# Patient Record
Sex: Female | Born: 1970 | Race: Black or African American | Hispanic: No | Marital: Married | Smoking: Former smoker
Health system: Southern US, Community
[De-identification: ages and names within clinical notes are randomized; demographics above are authoritative.]

---

## 2006-04-21 ENCOUNTER — Emergency Department: Payer: Self-pay | Admitting: Emergency Medicine

## 2007-01-16 ENCOUNTER — Emergency Department: Payer: Self-pay | Admitting: Emergency Medicine

## 2013-11-30 ENCOUNTER — Emergency Department: Payer: Self-pay | Admitting: Emergency Medicine

## 2015-06-27 ENCOUNTER — Ambulatory Visit: Payer: Self-pay

## 2015-07-11 ENCOUNTER — Ambulatory Visit: Payer: Self-pay

## 2015-07-18 ENCOUNTER — Encounter: Payer: Self-pay | Admitting: *Deleted

## 2015-07-18 ENCOUNTER — Ambulatory Visit: Payer: Self-pay | Attending: Oncology | Admitting: *Deleted

## 2015-07-18 ENCOUNTER — Ambulatory Visit
Admission: RE | Admit: 2015-07-18 | Discharge: 2015-07-18 | Disposition: A | Discharge status: Home / Self Care | Payer: Self-pay | Source: Ambulatory Visit | Attending: Oncology | Admitting: Oncology

## 2015-07-18 ENCOUNTER — Other Ambulatory Visit: Payer: Self-pay | Admitting: Oncology

## 2015-07-18 VITALS — BP 144/91 | HR 96 | Temp 96.7°F | Resp 16 | Ht 67.72 in | Wt 318.1 lb

## 2015-07-18 DIAGNOSIS — Z0000 Encounter for general adult medical examination without abnormal findings: Secondary | ICD-10-CM

## 2015-07-18 NOTE — Progress Notes (Signed)
Subjective:     Patient ID: Alexandra Velazquez, female   DOB: 1971-08-29, 44 y.o.   MRN: [id number]  HPI   Review of Systems     Objective:   Physical Exam  Pulmonary/Chest: Right breast exhibits no inverted nipple, no mass, no nipple discharge, no skin change and no tenderness. Left breast exhibits no inverted nipple, no mass, no nipple discharge and no skin change. Breasts are asymmetrical.    Genitourinary: Uterus is not deviated, not enlarged, not fixed and not tender. Cervix exhibits friability. Cervix exhibits no discharge. Right adnexum displays no mass, no tenderness and no fullness. Left adnexum displays no mass, no tenderness and no fullness. No erythema, tenderness or bleeding in the vagina. No foreign body around the vagina. No signs of injury around the vagina. No vaginal discharge found.         Assessment:     43 year ol Black obese female presents to [facility] for clinical breast exam and pap smear.  Clinical breast exam unremarkable.  Taught self breast awareness.  Patient states she has had somewhat irregular periods in the past, but had not had a period since January until this month.  States she had a normal period this month with heavy flow, lasting about 10 days. Discussed signs of menapause and  encouraged birth control.  Specimen collected for pap smear.    Plan:     Screening mammogram ordered.  Will follow-up per protocol.  Encouraged to discuss irregular cycle with her primary care provider.

## 2015-07-19 ENCOUNTER — Other Ambulatory Visit: Payer: Self-pay | Admitting: *Deleted

## 2015-07-19 DIAGNOSIS — N6489 Other specified disorders of breast: Secondary | ICD-10-CM

## 2015-07-23 ENCOUNTER — Ambulatory Visit: Payer: Self-pay

## 2015-07-23 ENCOUNTER — Ambulatory Visit
Admission: RE | Admit: 2015-07-23 | Discharge: 2015-07-23 | Disposition: A | Discharge status: Home / Self Care | Payer: Self-pay | Source: Ambulatory Visit | Attending: Oncology | Admitting: Oncology

## 2015-07-23 ENCOUNTER — Encounter: Payer: Self-pay | Admitting: *Deleted

## 2015-07-23 DIAGNOSIS — N6489 Other specified disorders of breast: Secondary | ICD-10-CM

## 2015-07-23 NOTE — Progress Notes (Signed)
Letter mailed from the [facility] to inform patient of her normal mammogram results.  Patient is to follow-up with annual screening in one year.  HSIS to Christy. 

## 2015-07-25 LAB — PAP LB AND HPV HIGH-RISK
HPV, HIGH-RISK: NEGATIVE
PAP SMEAR COMMENT: 0

## 2016-07-23 ENCOUNTER — Ambulatory Visit
Admission: RE | Admit: 2016-07-23 | Discharge: 2016-07-23 | Disposition: A | Discharge status: Home / Self Care | Payer: Self-pay | Source: Ambulatory Visit | Attending: Oncology | Admitting: Oncology

## 2016-07-23 ENCOUNTER — Encounter: Payer: Self-pay | Admitting: *Deleted

## 2016-07-23 ENCOUNTER — Other Ambulatory Visit: Payer: Self-pay | Admitting: *Deleted

## 2016-07-23 ENCOUNTER — Ambulatory Visit: Payer: Self-pay | Attending: Internal Medicine | Admitting: *Deleted

## 2016-07-23 ENCOUNTER — Encounter (INDEPENDENT_AMBULATORY_CARE_PROVIDER_SITE_OTHER): Payer: Self-pay

## 2016-07-23 ENCOUNTER — Other Ambulatory Visit: Payer: Self-pay | Admitting: Oncology

## 2016-07-23 VITALS — BP 146/96 | HR 106 | Temp 98.5°F | Ht 67.32 in | Wt 322.9 lb

## 2016-07-23 DIAGNOSIS — Z0000 Encounter for general adult medical examination without abnormal findings: Secondary | ICD-10-CM

## 2016-07-23 NOTE — Patient Instructions (Signed)
Gave patient hand-out, Women Staying Healthy, Active and Well from [facility], with education on breast health, pap smears, heart and colon health. 

## 2016-07-23 NOTE — Progress Notes (Signed)
Subjective:     Patient ID: Alexandra Velazquez, female   DOB: 1971-01-20, 45 y.o.   MRN: [id number]  HPI   Review of Systems     Objective:   Physical Exam  Pulmonary/Chest: Right breast exhibits no inverted nipple, no mass, no nipple discharge, no skin change and no tenderness. Left breast exhibits no inverted nipple, no mass, no nipple discharge, no skin change and no tenderness. Breasts are symmetrical.    Abdominal: There is no splenomegaly or hepatomegaly.  Genitourinary: No labial fusion. There is no rash, tenderness, lesion or injury on the right labia. There is no rash, tenderness, lesion or injury on the left labia. Cervix exhibits no motion tenderness, no discharge and no friability. Right adnexum displays no mass, no tenderness and no fullness. Left adnexum displays no mass, no tenderness and no fullness. No erythema, tenderness or bleeding in the vagina. No foreign body in the vagina. No signs of injury around the vagina. No vaginal discharge found.       Assessment:     45 year old Black female returns to [facility] for annual screening.  Clinical breast exam unremarkable.  Taught self breast awareness.  Specimen collected for pap smear.  Blood pressure elevated at  164/92, and rechecked at 146/96.  Discussed side effects of uncontrolled hypertension.  She is to recheck her blood pressure at [facility] or [facility], and if remains higher than 140/90 she is to follow-up with her primary care provider.  Hand out on hypertention given to patient.  Patient has been screened for eligibility.  She does not have any insurance, Medicare or Medicaid.  She also meets financial eligibility.  Hand-out given on the Affordable Care Act.    Plan:     Screening mammogram ordered.  Specimen for pap smear sent to the lab.  Will follow-up per [facility] protocol.

## 2016-07-24 LAB — PAP LB AND HPV HIGH-RISK
HPV, high-risk: NEGATIVE
PAP Smear Comment: 0

## 2016-07-24 LAB — PLEASE NOTE

## 2016-07-29 ENCOUNTER — Encounter: Payer: Self-pay | Admitting: *Deleted

## 2016-07-29 NOTE — Progress Notes (Signed)
Letter mailed to inform patient of her normal mammogram and pap smear.  Next mammo due in one year, and pap due in 5 years.  HSIS to Christy. 

## 2017-09-16 ENCOUNTER — Encounter: Payer: Self-pay | Admitting: *Deleted

## 2017-09-16 ENCOUNTER — Ambulatory Visit
Admission: RE | Admit: 2017-09-16 | Discharge: 2017-09-16 | Disposition: A | Discharge status: Home / Self Care | Payer: Self-pay | Source: Ambulatory Visit | Attending: Oncology | Admitting: Oncology

## 2017-09-16 ENCOUNTER — Ambulatory Visit: Payer: Self-pay | Attending: Oncology | Admitting: *Deleted

## 2017-09-16 VITALS — BP 146/94 | HR 98 | Temp 97.8°F | Wt 324.0 lb

## 2017-09-16 DIAGNOSIS — Z0000 Encounter for general adult medical examination without abnormal findings: Secondary | ICD-10-CM

## 2017-09-16 NOTE — Patient Instructions (Signed)
Gave patient hand-out, Women Staying Healthy, Active and Well from [facility], with education on breast health, pap smears, heart and colon health. 

## 2017-09-16 NOTE — Progress Notes (Signed)
Subjective:     Patient ID: Alexandra Velazquez, female   DOB: [birth year], 46 y.o.   MRN: [id number]  HPI   Review of Systems     Objective:   Physical Exam  Pulmonary/Chest: Right breast exhibits no inverted nipple, no mass, no nipple discharge, no skin change and no tenderness. Left breast exhibits no mass, no nipple discharge, no skin change and no tenderness. Breasts are symmetrical.         Assessment:     46 year old Black female returns to [facility] for annual screening.  Clinical breast exam unremarkable.  Taught self breast awareness.  Last pap on 07/24/16 was negative / negative.  Next pap in 2022.  Blood pressure elevated at 146/94.  She is to recheck her blood pressure at [facility] or [facility], and if remains higher than 140/90 she is to follow-up with her primary care provider.  Patient has been screened for eligibility.  She does not have any insurance, Medicare or Medicaid.  She also meets financial eligibility.  Hand-out given on the Affordable Care Act.    Plan:     Screening mammogram ordered.  Will follow-up per [facility] protocol.

## 2017-09-23 ENCOUNTER — Encounter: Payer: Self-pay | Admitting: *Deleted

## 2017-09-23 NOTE — Progress Notes (Signed)
Letter mailed from the [facility] to inform patient of her normal mammogram results.  Patient is to follow-up with annual screening in one year.  HSIS to Christy. 

## 2018-11-17 ENCOUNTER — Ambulatory Visit: Payer: Self-pay

## 2020-06-27 ENCOUNTER — Other Ambulatory Visit: Payer: Self-pay

## 2020-06-27 ENCOUNTER — Ambulatory Visit: Payer: Self-pay | Attending: Oncology | Admitting: *Deleted

## 2020-06-27 ENCOUNTER — Encounter: Payer: Self-pay | Admitting: *Deleted

## 2020-06-27 ENCOUNTER — Ambulatory Visit
Admission: RE | Admit: 2020-06-27 | Discharge: 2020-06-27 | Disposition: A | Discharge status: Home / Self Care | Payer: Self-pay | Source: Ambulatory Visit | Attending: Oncology | Admitting: Oncology

## 2020-06-27 VITALS — BP 154/87 | HR 96 | Temp 97.3°F | Ht 66.25 in | Wt 305.0 lb

## 2020-06-27 DIAGNOSIS — Z0000 Encounter for general adult medical examination without abnormal findings: Secondary | ICD-10-CM | POA: Insufficient documentation

## 2020-06-27 NOTE — Patient Instructions (Signed)
Gave patient hand-out, Women Staying Healthy, Active and Well from [facility], with education on breast health, pap smears, heart and colon health. 

## 2020-06-27 NOTE — Progress Notes (Signed)
  Subjective:     Patient ID: Alexandra Velazquez, female   DOB: November 16, 1971, 49 y.o.   MRN: [id number]  HPI  [facility] Medical History Record - 06/27/20 1036      Breast History   Screening cycle New    CBE Date 09/16/17    Provider (CBE) [facility]    Initial Mammogram 06/27/20    Last Mammogram Annual    Last Mammogram Date 09/16/17    Provider (Mammogram)  Delford Field    Recent Breast Symptoms None      Breast Cancer History   Breast Cancer History No personal or family history    Comments/Details Maternal Aunt with BC in her 22's      Previous History of Breast Problems   Breast Surgery or Biopsy None    Breast Implants N/A    BSE Done Monthly      Gynecological/Obstetrical History   LMP 06/05/20    Is there any chance that the client could be pregnant?  No    Age at menarche 87    Age at menopause perimenopausal    PAP smear history Annually    Date of last PAP  07/23/16    Provider (PAP) Dr. Doylene Canning    Age at first live birth 64    Breast fed children No    DES Exposure No    Cervical, Uterine or Ovarian cancer No    Family history of Cervial, Uterine or Ovarian cancer No    Hysterectomy No    Cervix removed No    Ovaries removed No    Laser/Cryosurgery No    Current method of birth control None    Current method of Estrogen/Hormone replacement None    Smoking history None    Comments No ins / 2 in [facility] / Both household members on unemployment - $41280/yr           Review of Systems     Objective:   Physical Exam Chest:     Breasts: Breasts are asymmetrical.        Right: No swelling, bleeding, inverted nipple, mass, nipple discharge, skin change or tenderness.        Left: No swelling, bleeding, inverted nipple, mass, nipple discharge, skin change or tenderness.     Comments: Left breast larger than the right Lymphadenopathy:     Upper Body:     Right upper body: No supraclavicular or axillary adenopathy.     Left upper body: No supraclavicular or axillary adenopathy.         Assessment:     49 year old Black female returns to [facility] for annual screening.  Clinical breast exam unremarkable.  Taught self breast awareness.  Last pap on 07/23/16 ws negative / negative.  Next pap due in 2022.  Patient has been screened for eligibility.  She does not have any insurance, Medicare or Medicaid.  She also meets financial eligibility.   Risk Assessment    Risk Scores      06/27/2020   Last edited by: Alta Corning, CMA   5-year risk: 1.1 %   Lifetime risk: 8.8 %            Plan:     Screening mammogram ordered.  Will follow up in one year.

## 2020-06-28 ENCOUNTER — Encounter: Payer: Self-pay | Admitting: *Deleted

## 2020-06-28 NOTE — Progress Notes (Signed)
Letter mailed from the [facility] to inform patient of her normal mammogram results.  Patient is to follow-up with annual screening in one year. 

## 2021-04-27 IMAGING — MG DIGITAL SCREENING BILAT W/ TOMO W/ CAD
8 series · 8 of 24 positions shown · non-contrast
Comparison: Previous exam(s).

CLINICAL DATA: Screening.

EXAM:
DIGITAL SCREENING BILATERAL MAMMOGRAM WITH TOMO AND CAD

[R CC synth-2D]
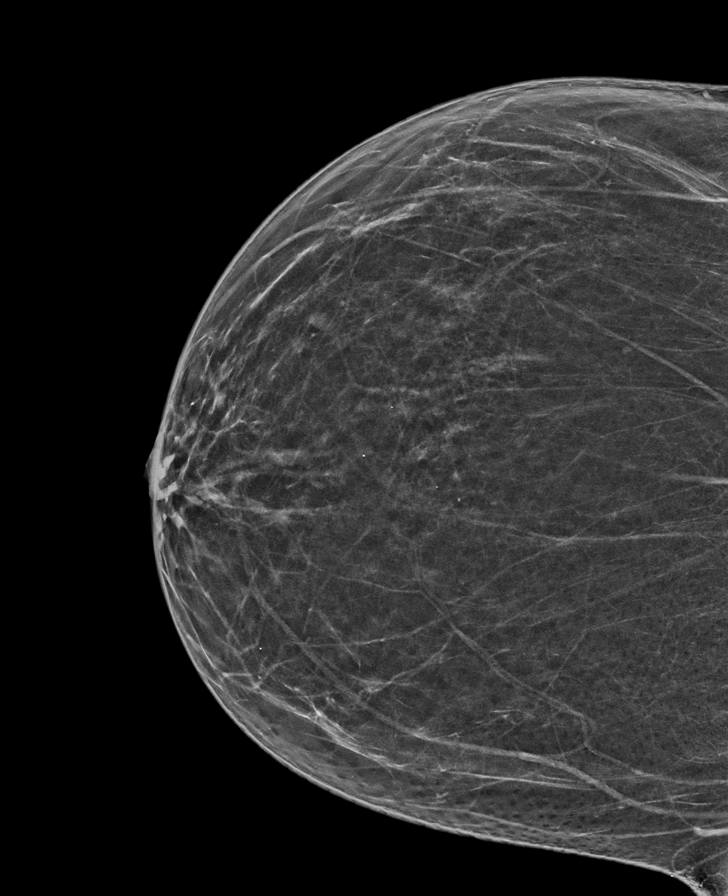

[L CC synth-2D]
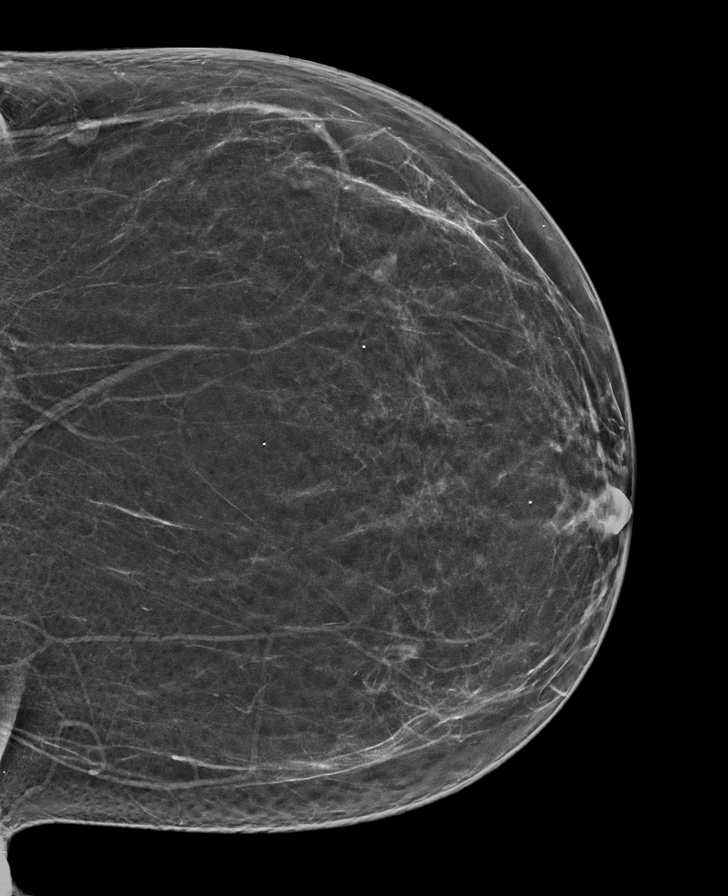

[L MLO synth-2D]
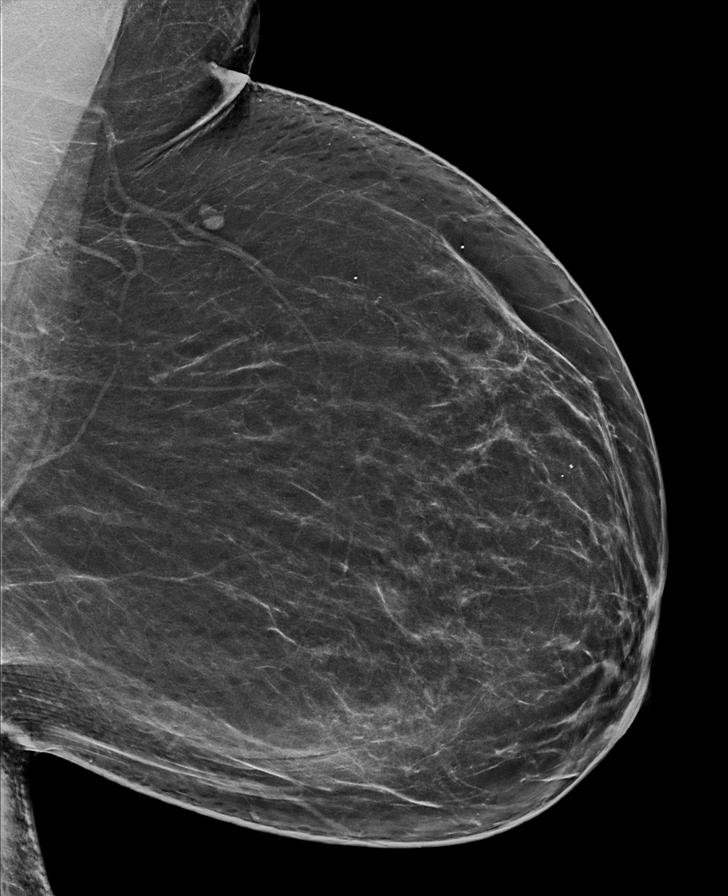

[R MLO synth-2D]
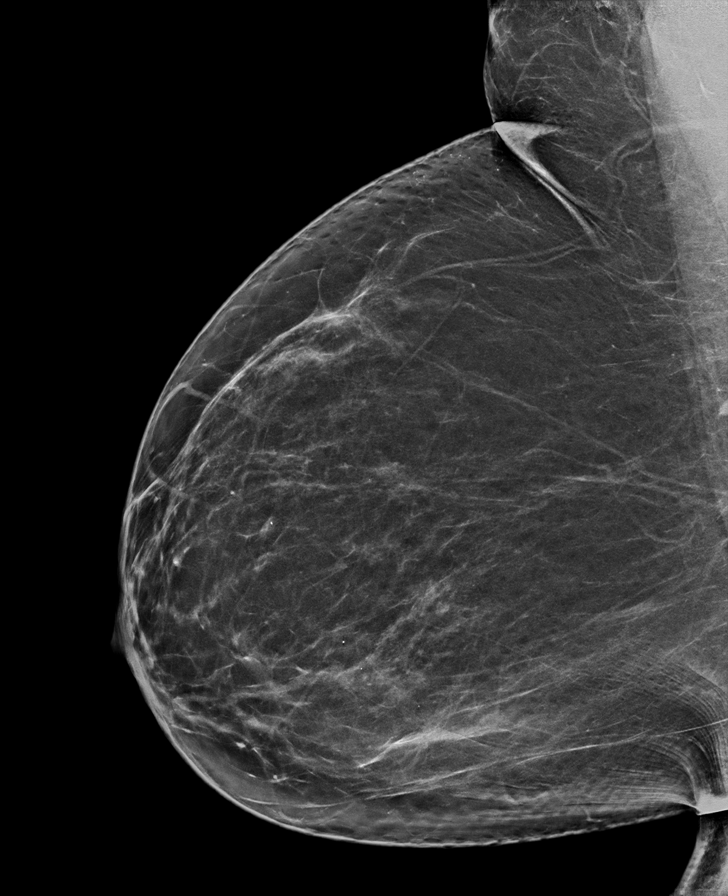

[R CC tomo · tomo slice 33/65.0]
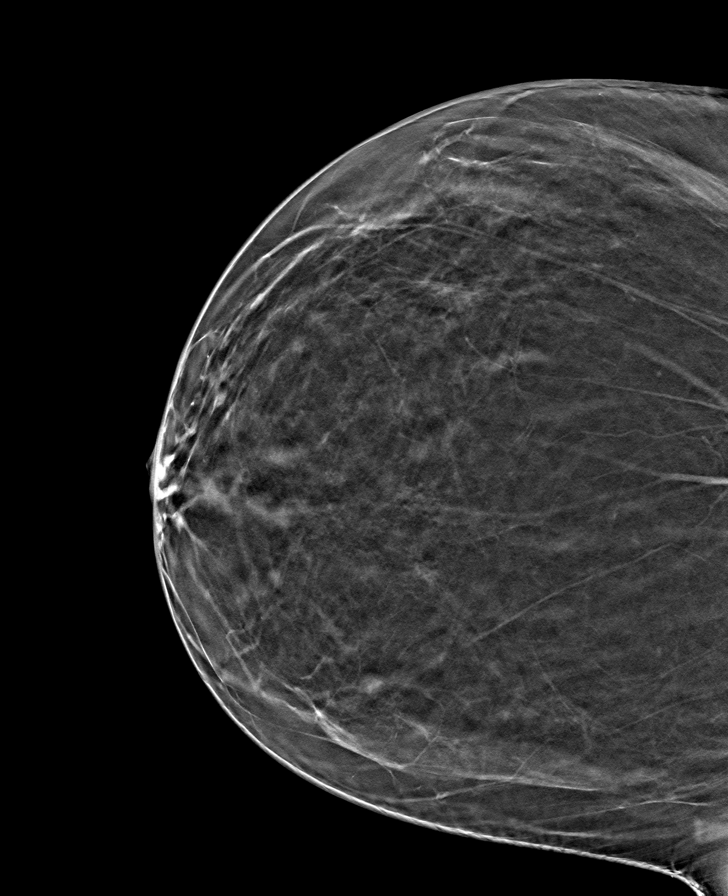

[R MLO tomo · tomo slice 39/78.0]
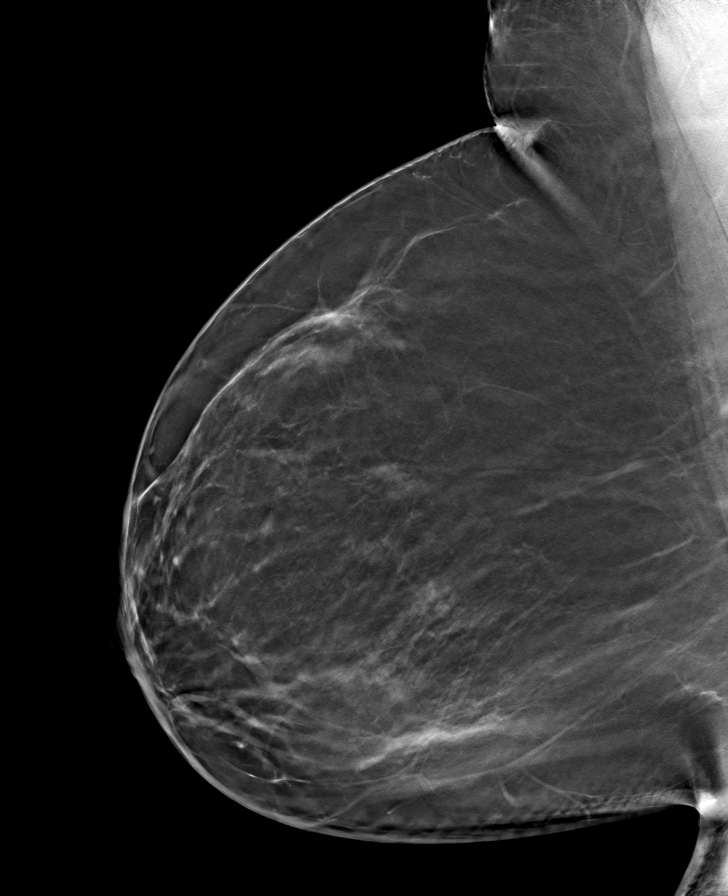

[L CC tomo · tomo slice 37/72.0]
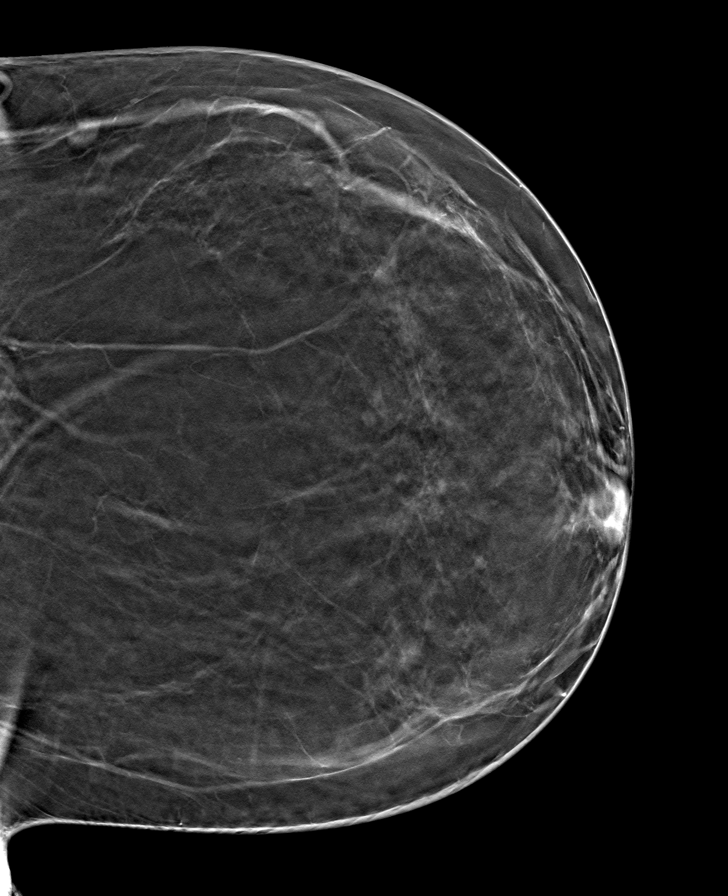

[L MLO tomo · tomo slice 41/80.0]
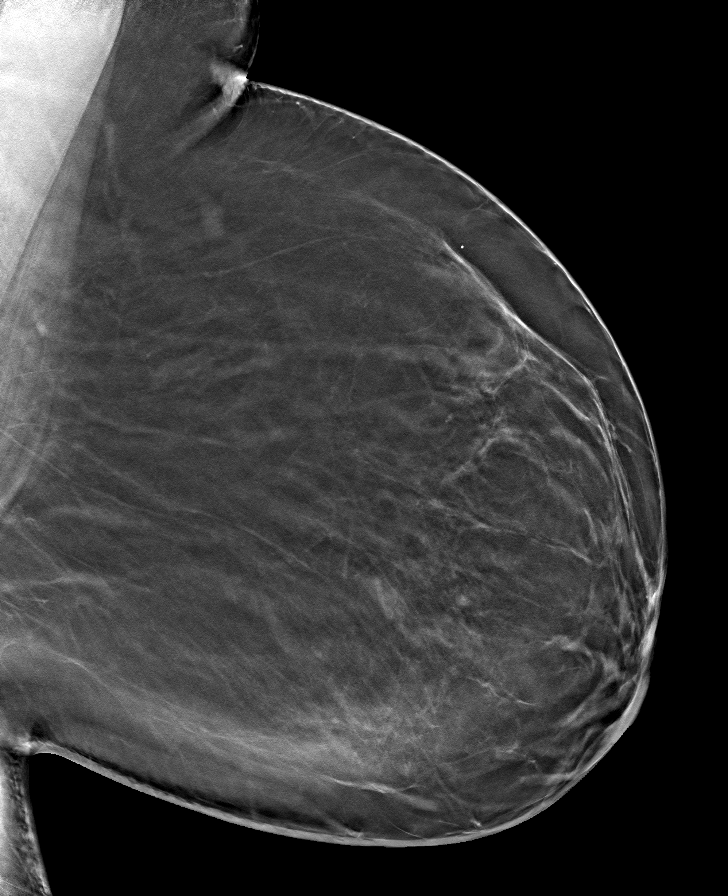

[8 of 24 positions shown; findings below may reference images not displayed]

ACR Breast Density Category b: There are scattered areas of
fibroglandular density.
FINDINGS: There are no findings suspicious for malignancy. Images were
processed with CAD.
IMPRESSION: No mammographic evidence of malignancy. A result letter of this
screening mammogram will be mailed directly to the patient.

RECOMMENDATION:
Screening mammogram in one year. (Code:[id number])

BI-RADS CATEGORY  1: Negative.

## 2023-08-11 ENCOUNTER — Ambulatory Visit: Payer: BLUE CROSS/BLUE SHIELD | Admitting: Rehabilitative and Restorative Service Providers"
